# Patient Record
Sex: Female | Born: 1935 | Race: White | Hispanic: No
Health system: Southern US, Community
[De-identification: ages and names within clinical notes are randomized; demographics above are authoritative.]

---

## 2005-10-20 ENCOUNTER — Emergency Department: Payer: Self-pay | Admitting: Emergency Medicine

## 2005-10-20 ENCOUNTER — Other Ambulatory Visit: Payer: Self-pay

## 2011-10-08 ENCOUNTER — Emergency Department: Payer: Self-pay | Admitting: *Deleted

## 2011-10-26 ENCOUNTER — Ambulatory Visit: Payer: Self-pay | Admitting: Internal Medicine

## 2011-10-27 ENCOUNTER — Inpatient Hospital Stay: Payer: Self-pay | Admitting: Surgery

## 2011-10-27 LAB — URINALYSIS, COMPLETE
Ketone: NEGATIVE
Ph: 6 (ref 4.5–8.0)
Protein: NEGATIVE
RBC,UR: 9 /HPF (ref 0–5)
Specific Gravity: 1.008 (ref 1.003–1.030)
Squamous Epithelial: <1
WBC UR: 118 /HPF (ref 0–5)

## 2011-10-27 LAB — COMPREHENSIVE METABOLIC PANEL
Anion Gap: 8 (ref 7–16)
Bilirubin,Total: 0.7 mg/dL (ref 0.2–1.0)
Chloride: 100 mmol/L (ref 98–107)
Co2: 27 mmol/L (ref 21–32)
Creatinine: 0.98 mg/dL (ref 0.60–1.30)
EGFR (African American): >60
EGFR (Non-African Amer.): 56 — ABNORMAL LOW
Glucose: 99 mg/dL (ref 65–99)
Osmolality: 272 (ref 275–301)
SGOT(AST): 39 U/L — ABNORMAL HIGH (ref 15–37)
SGPT (ALT): 14 U/L
Sodium: 135 mmol/L — ABNORMAL LOW (ref 136–145)

## 2011-10-27 LAB — CBC
HGB: 13.9 g/dL (ref 12.0–16.0)
MCV: 98 fL (ref 80–100)
Platelet: 188 10*3/uL (ref 150–440)
RBC: 4.26 10*6/uL (ref 3.80–5.20)

## 2011-10-30 LAB — URINE CULTURE

## 2011-11-25 ENCOUNTER — Ambulatory Visit: Payer: Self-pay | Admitting: Internal Medicine

## 2012-02-25 DEATH — deceased

## 2013-07-07 IMAGING — CT CT HEAD WITHOUT CONTRAST
2 series · 15 of 30 positions shown, 19 images · non-contrast
Comparison: none

REASON FOR EXAM: fall head injury
COMMENTS:

[Series 2: without · axial · non-contrast · 0.45mm/px · z∈[-158,-28]mm · 13 of 32 slices shown, 17 images]
[im 3/32  brain]
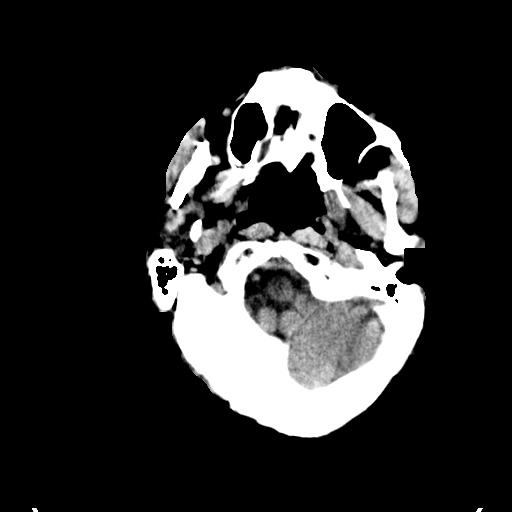
[im 3/32  bone]
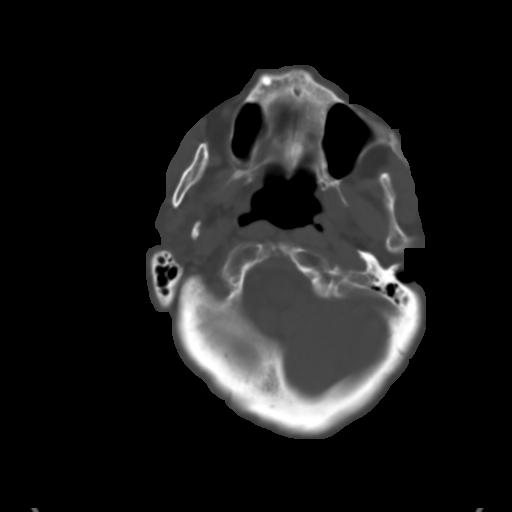
[im 5/32  brain]
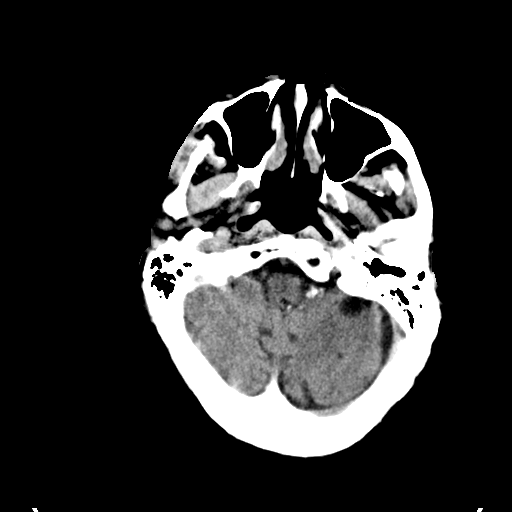
[im 7/32  brain]
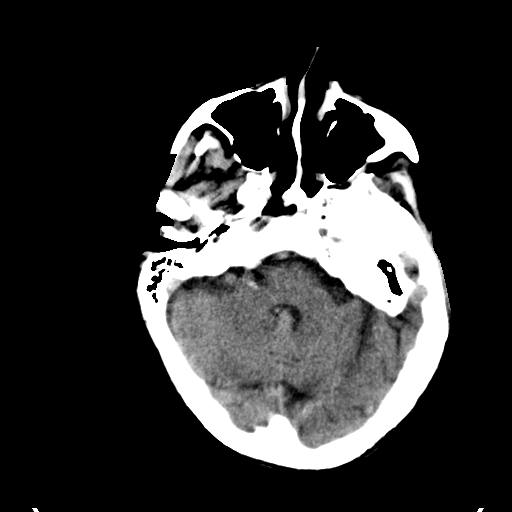
[im 9/32  brain]
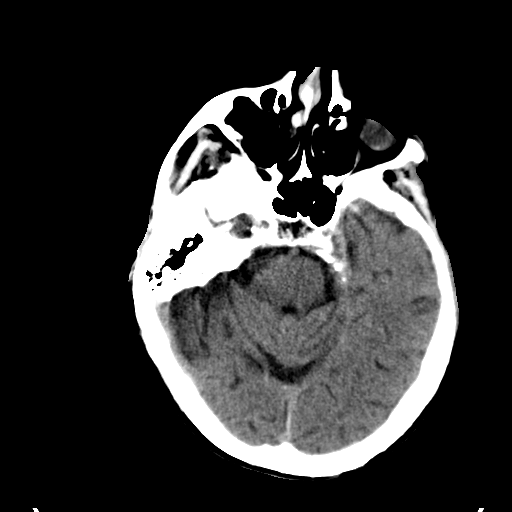
[im 12/32  brain]
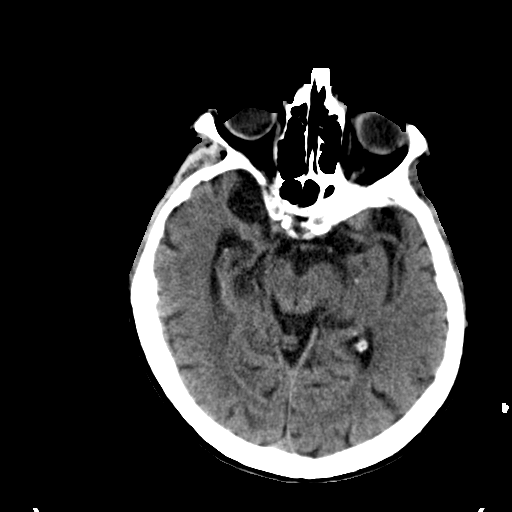
[im 12/32  bone]
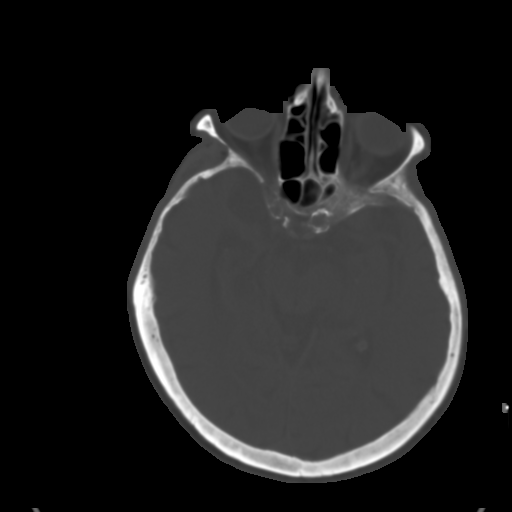
[im 14/32  brain]
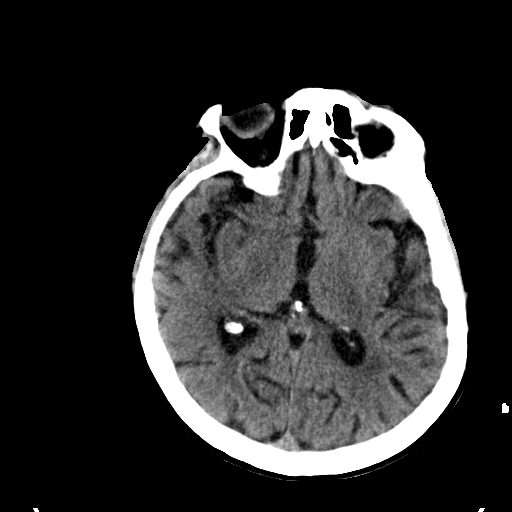
[im 16/32  brain]
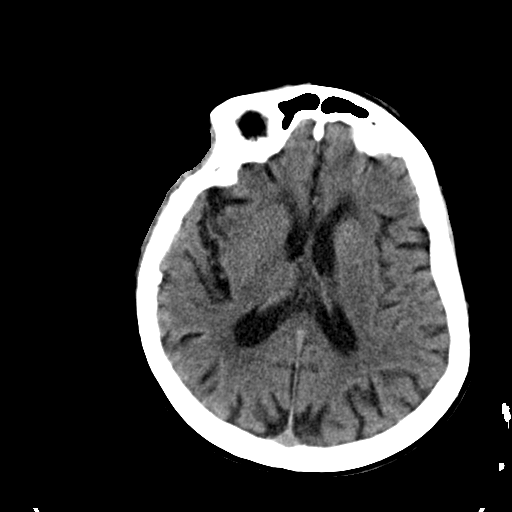
[im 18/32  brain]
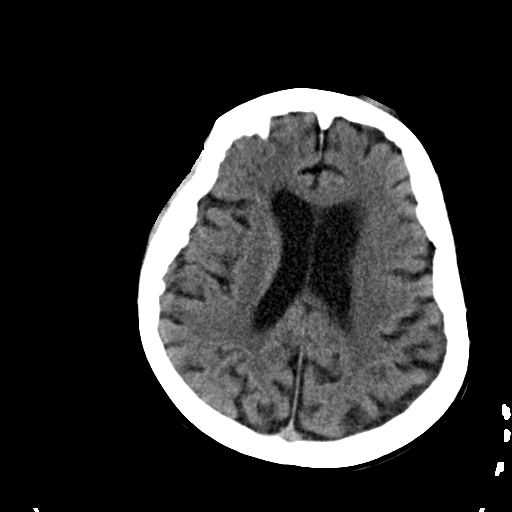
[im 20/32  brain]
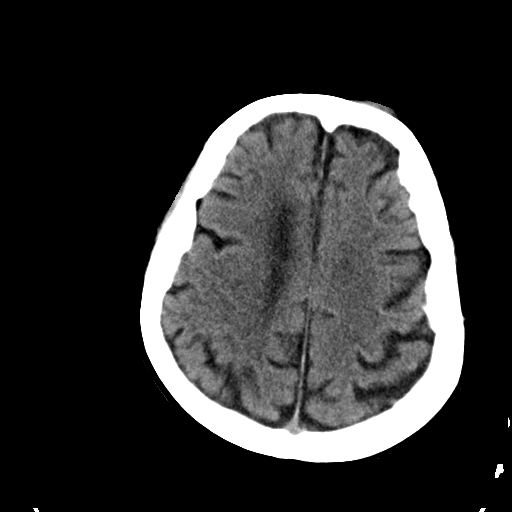
[im 20/32  bone]
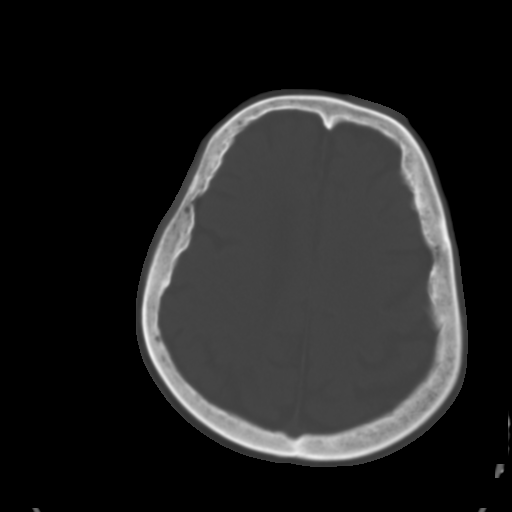
[im 23/32  brain]
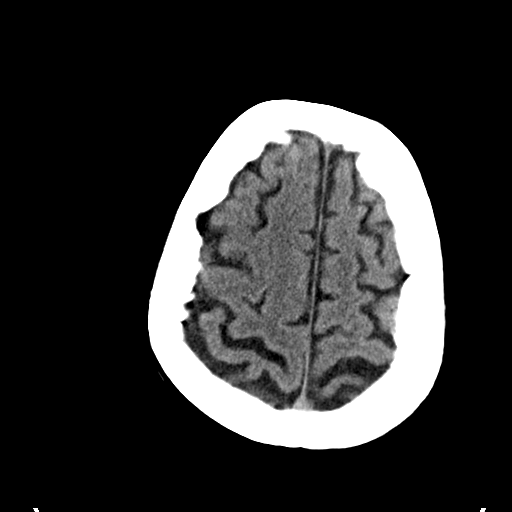
[im 25/32  brain]
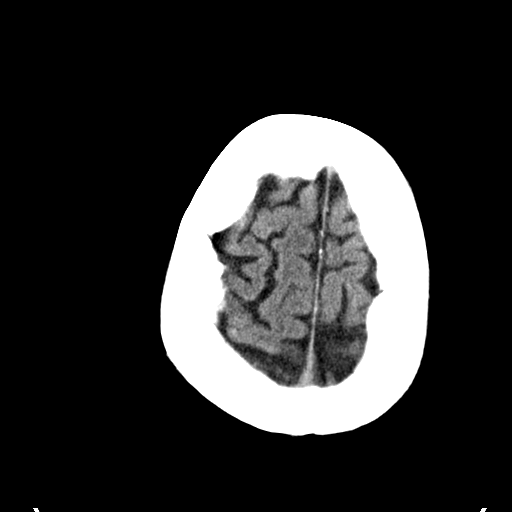
[im 27/32  brain]
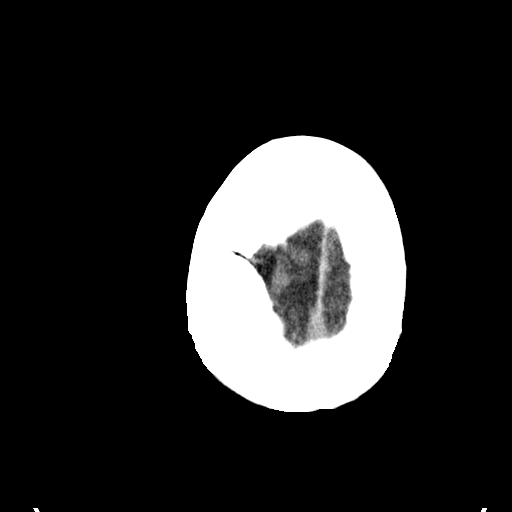
[im 29/32  brain]
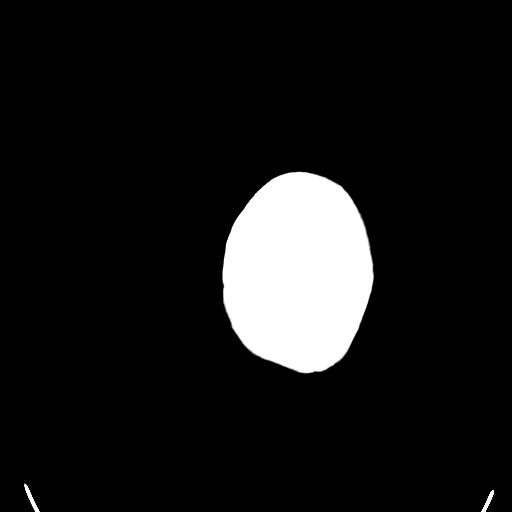
[im 29/32  bone]
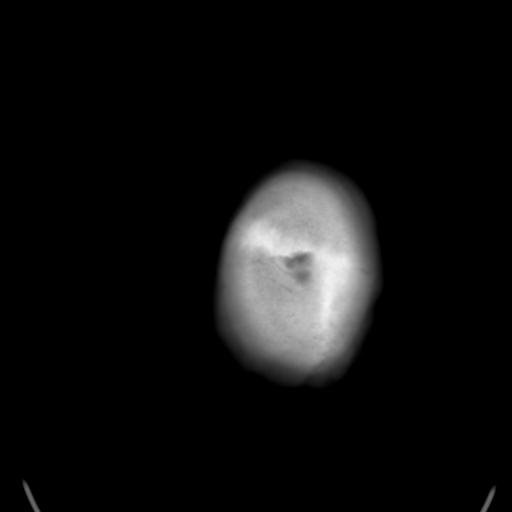

[Series 3: bone · axial · 0.45mm/px · z∈[-158,-138]mm · 2 of 32 slices shown]
[im 3/32  bone]
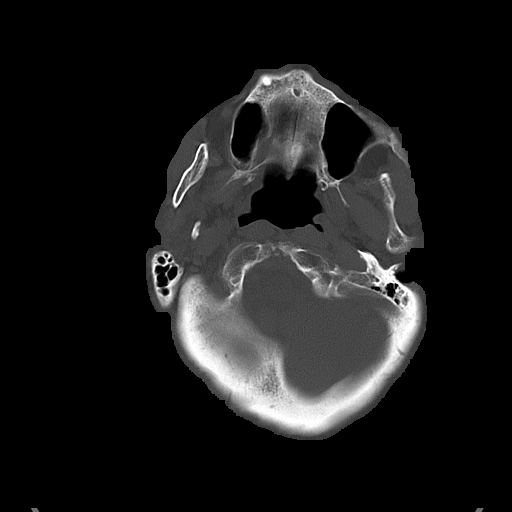
[im 7/32  bone]
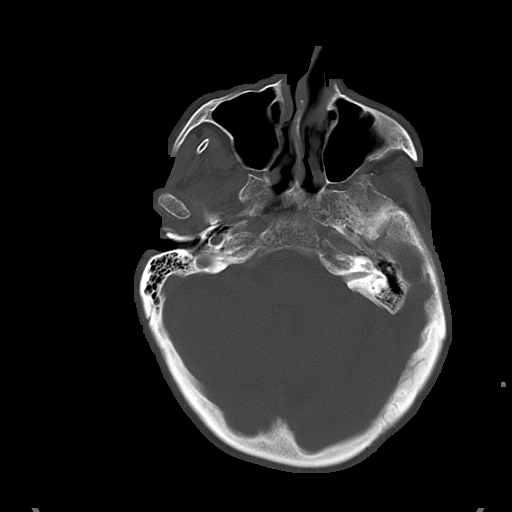

[15 of 30 positions shown; findings below may reference images not displayed]

PROCEDURE:     CT  - CT HEAD WITHOUT CONTRAST  - October 09, 2011  [DATE]

RESULT:     Axial noncontrast CT scanning was performed through the brain
with reconstructions at 5 mm intervals and slice thicknesses.

There are mild age-related atrophic changes with compensatory
ventriculomegaly. There is no intracranial hemorrhage nor intracranial mass
effect. There is no evidence of an evolving ischemic infarction. There is
dense calcification in the region of the distal aspect of the left internal
carotid artery which may reflect an aneurysm. This measures no more than 7
mm in diameter and there is no evidence of active bleeding.

At bone window settings the observed portions of the paranasal sinuses and
mastoid air cells are clear. There is soft tissue swelling over the left
orbit. The globe is grossly intact and I see no significant pre- or
postseptal edema.
IMPRESSION: 1. There are mild age-related atrophic changes.
2. There is no evidence of an acute ischemic or hemorrhagic infarction.
3. There is calcification in the region of the distal left internal carotid
artery which may reflect a aneurysm measuring up to 7 mm in diameter. There
is a small amount of soft tissue swelling above the left orbit. I see no
underlying fracture.

A preliminary report was sent to the [HOSPITAL] the conclusion
of the study.

## 2013-07-25 IMAGING — CT CT ABD-PELV W/O CM
1 of 2 series · 14 of 32 positions shown, 18 images · non-contrast
Comparison: none

REASON FOR EXAM: (1) pain; (2) pain  no oral no iv  contrast
COMMENTS:

[Series 2: 3mm soft tissue · axial · 0.76mm/px · z∈[-700,-338]mm · 14 of 138 slices shown, 18 images]
[im 11/138  soft-tissue]
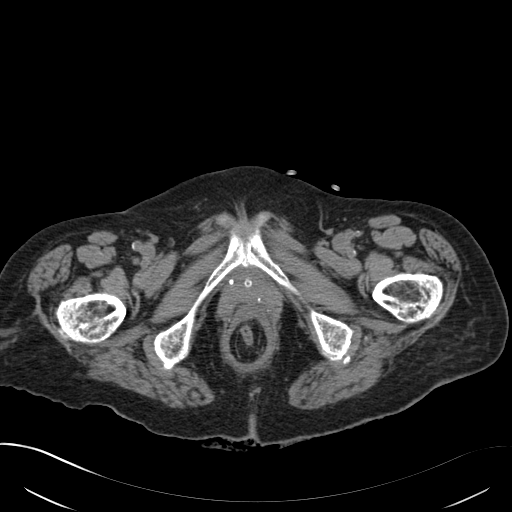
[im 11/138  bone]
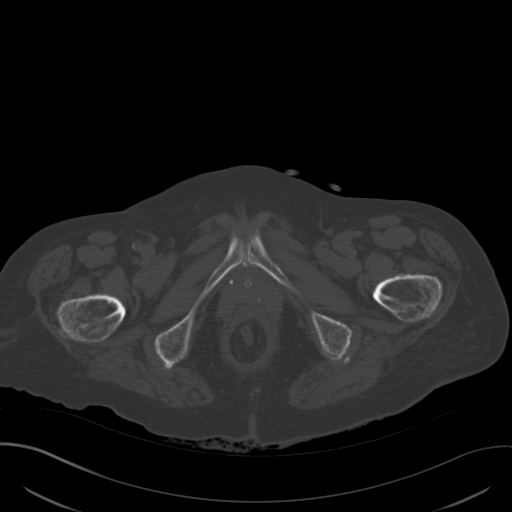
[im 22/138  soft-tissue]
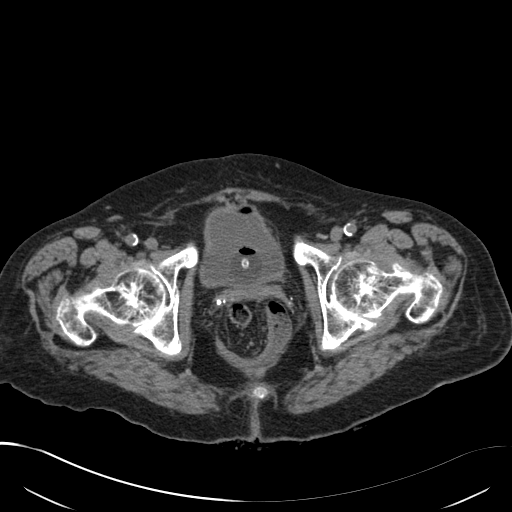
[im 33/138  soft-tissue]
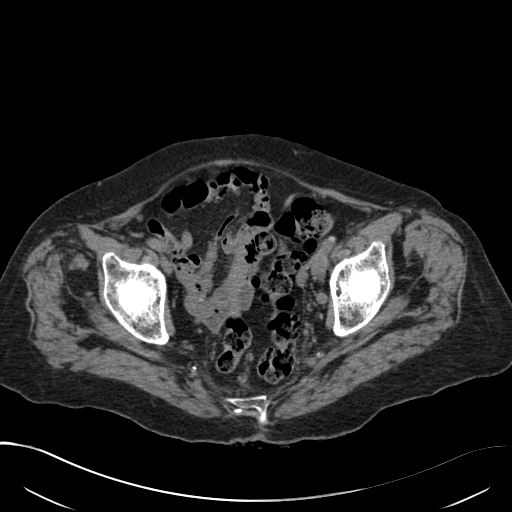
[im 44/138  soft-tissue]
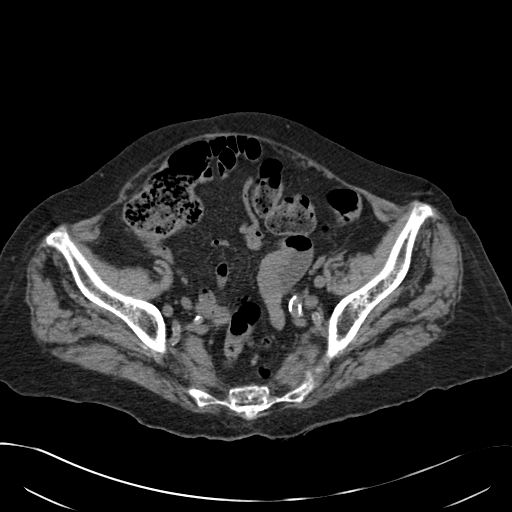
[im 55/138  soft-tissue]
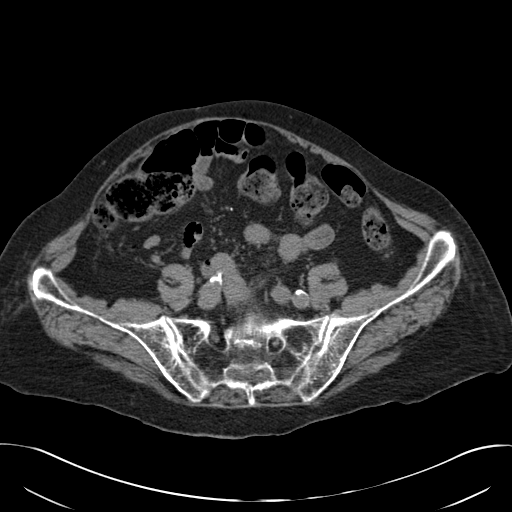
[im 66/138  soft-tissue]
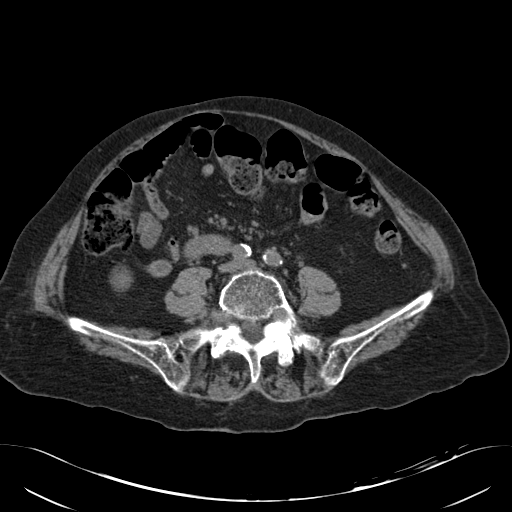
[im 77/138  soft-tissue]
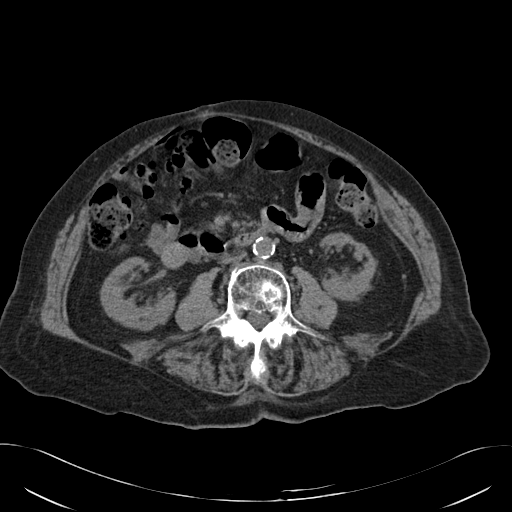
[im 88/138  soft-tissue]
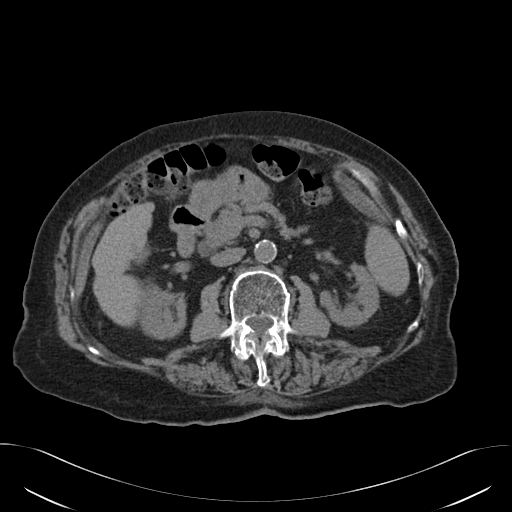
[im 99/138  soft-tissue]
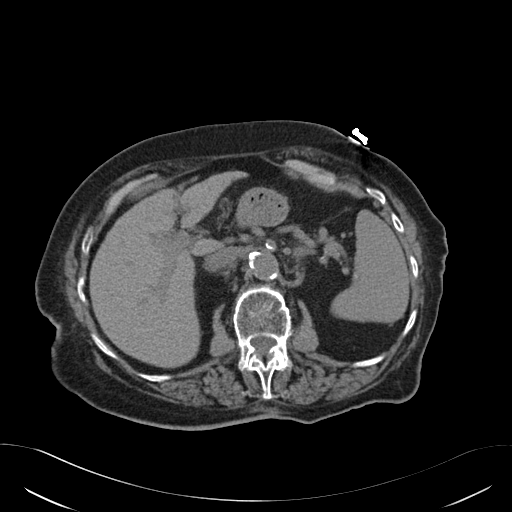
[im 99/138  bone]
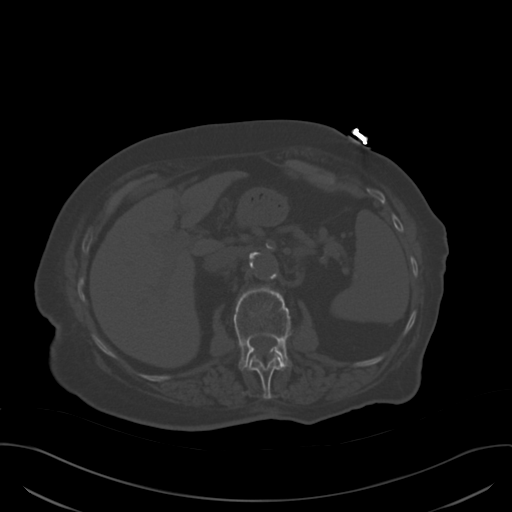
[im 110/138  soft-tissue]
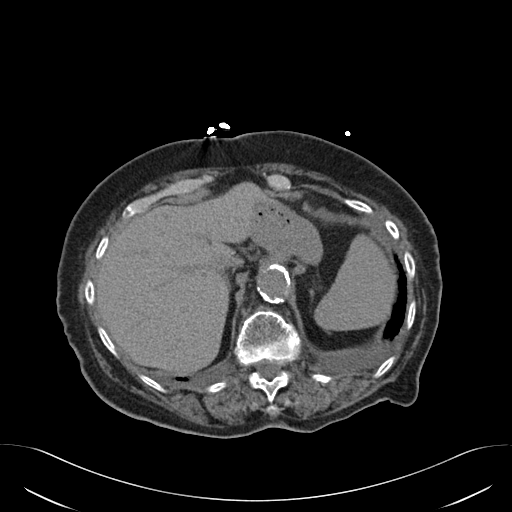
[im 116/138  lung]
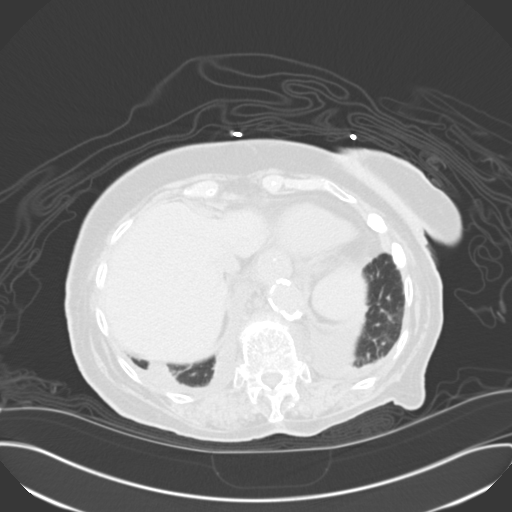
[im 121/138  soft-tissue]
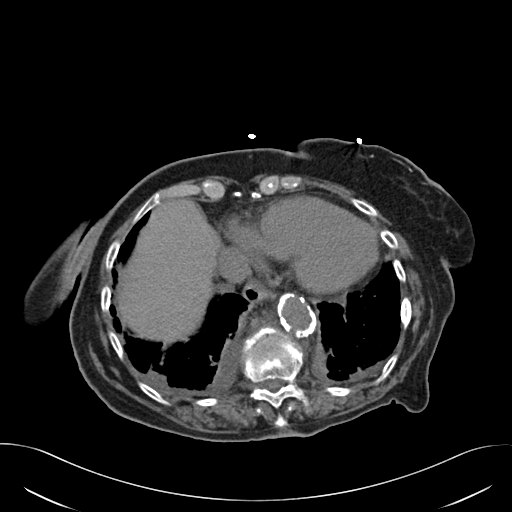
[im 121/138  lung]
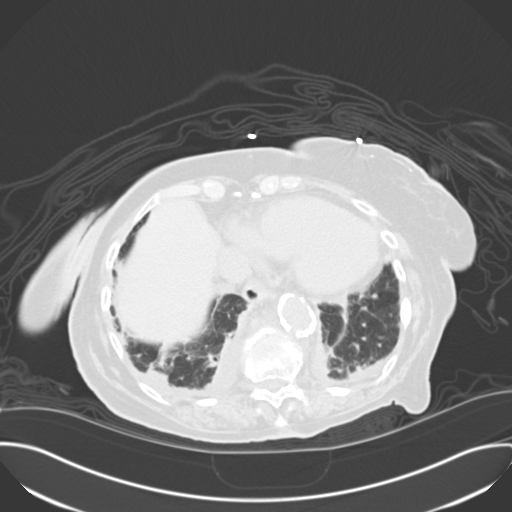
[im 127/138  lung]
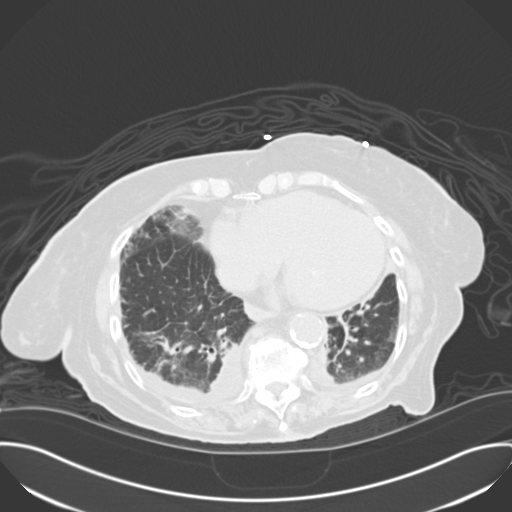
[im 132/138  soft-tissue]
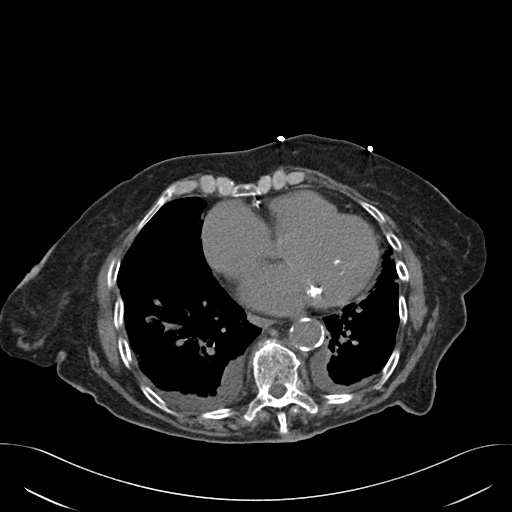
[im 132/138  lung]
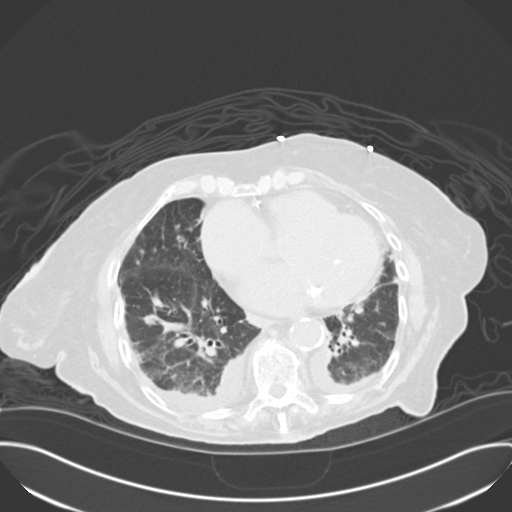

[14 of 32 positions shown; findings below may reference images not displayed]

PROCEDURE:     CT  - CT ABDOMEN AND PELVIS W[DATE]  [DATE]

RESULT:     Axial noncontrast CT scanning was performed through the abdomen
and pelvis with reconstructions at 3 mm intervals and slice thicknesses.
Review of multiplanar reconstructed images was performed separately on the
VIA monitor.

There are small bilateral pleural effusions layering posteriorly. There are
patchy areas of subsegmental atelectasis in both lower lobes. There is also
peribronchial cuffing. The cardiac chambers are enlarged.

The liver exhibits no focal mass. The gallbladder is surgically absent. The
spleen is not enlarged. The stomach is nondistended. There are no adrenal
masses. The pancreas is largely fatty replaced. The caliber of the abdominal
aorta is normal. The unopacified loops of small and large bowel exhibit no
evidence of ileus nor of obstruction. The kidneys exhibit no evidence of
obstruction. The perinephric fat is normal in density. I see no stones
within either kidney. There is likely a hyperdense cyst in the anterior
aspect of the upper pole of the left kidney measuring approximately 8 mm in
diameter.

Within the pelvis the urinary bladder is partially distended with fluid and
air. A Foley catheter balloon is present. The perirectal fat at the anal
region is consistent with the clinically known prolapse. There is also air
in the perirectal soft tissues on the left which is nonspecific in
appearance and may be related to the prolapse. I see no pelvic nor inguinal
lymphadenopathy.
IMPRESSION: 1. I do not see evidence of bowel obstruction or ileus. There is prolapse of
the rectum as noted clinically as well is prolapse of perirectal fat. This
extends off the inferior margin of the scan. There is gas in the perirectal
soft tissues on images 115 through 127. This may reflect a fistulous
connection or could possibly reflect a perirectal abscess. But with the
distortion of the anatomy due to the prolapse this could reflect a
physiologic gas collection. Correlation with the patient's clinical exam of
the perirectal region is needed.
2. I see no acute urinary tract abnormality nor acute hepatobiliary
abnormality.
3. There are small bilateral pleural effusions. There is bibasilar
atelectasis. There is enlargement of the cardiac chambers.

## 2014-11-18 NOTE — Discharge Summary (Signed)
PATIENT NAME:  Jill Li, Jill Li MR#:  [id number] DATE OF BIRTH:  Dec 12, 1909  DATE OF ADMISSION:  10/27/2011 DATE OF DISCHARGE:  11/02/2011   FINAL DIAGNOSIS: Rectal prolapse.   HOSPITAL COURSE SUMMARY: The patient was admitted with an irreducible rectal prolapse. She was taken to the operating room after admission to the hospital on the third by Dr. Michela PitcherEly at which point a successful manual decompression was able to be performed. She was also seen by palliative care. The patient was NO CODE/DO NOT RESUSCITATE during her hospital stay. Her diet was able to be advanced and she had normal bowel function following this with no evidence of recurrent rectal prolapse. Discussions were undertaken with the family regarding further care if prolapse recurred. The patient had some unsteadiness on her feet and for this reason the referral was made for [facility] upon discharge. This was accomplished on 04/08. The patient had no further evidence of rectal prolapse during her hospitalization following the reduction in the operating room.   DISCHARGE MEDICATIONS:  1. MiraLAX 17 grams by mouth daily.  2. Dulcolax 10 mg by mouth at bedtime. 3. Septra double strength 1 tab p.o. b.i.d. for five days.   DIET:  Regular.  ACTIVITY: As per physical therapy.   FOLLOWUP: P.r.n. followup in the office   ____________________________ Redge GainerMark A. Egbert GaribaldiBird, MD mab:bjt D: 11/02/2011 10:27:39 ET T: 11/02/2011 11:01:37 ET JOB#: [id number]  cc: Steele SizerMark A. Crissman, MD Raynald KempMARK A Aquarius Tremper MD ELECTRONICALLY SIGNED 11/02/2011 11:58

## 2014-11-18 NOTE — Consult Note (Signed)
Chief Complaint:   Subjective/Chief Complaint status post reduction of rectal prolapse y'day, doing ok, doesn't talk   VITAL SIGNS/ANCILLARY NOTES: **Vital Signs.:   04-Apr-13 10:00   Vital Signs Type Q 4hr   Temperature Temperature (F) 98.8   Celsius 37.1   Temperature Source axillary   Pulse Pulse 69   Pulse source per Dinamap   Respirations Respirations 18   Systolic BP Systolic BP 105   Diastolic BP (mmHg) Diastolic BP (mmHg) 54   Mean BP 71   BP Source Dinamap   Pulse Ox % Pulse Ox % 94   Pulse Ox Activity Level  At rest   Oxygen Delivery Room Air/ 21 %   Brief Assessment:   Cardiac Regular  murmur present    Respiratory normal resp effort    Gastrointestinal Normal   Assessment/Plan:  Assessment/Plan:   Assessment 1. rectal prolapse: status post reduction in OR by dr Michela Pitcherely, doing fine.  2. GNR urinary tract infection: based on urinalysis, urine c/s growing GNR. continue rocephin, can discharge on po cipro for 1 more day to finish 3 days course of uncomplicated Urinary Tract Infection. if here rocephin can be stopped after tomorrow's dose.  3. dementia: at baseline.  time spent : 15 min    Plan medically clear. Will sign off.  Please call with questions.   Electronic Signatures: Patricia PesaShah, Grey Schlauch S (MD)  (Signed 04-Apr-13 13:18)  Authored: Chief Complaint, VITAL SIGNS/ANCILLARY NOTES, Brief Assessment, Assessment/Plan   Last Updated: 04-Apr-13 13:18 by Patricia PesaShah, Floyd Wade S (MD)

## 2014-11-18 NOTE — H&P (Signed)
PATIENT NAME:  Jill Li MR#:  [id number] DATE OF BIRTH:  1910/03/29  DATE OF ADMISSION:  10/27/2011  PRIMARY CARE PHYSICIAN: Dr. Dossie Arbour ADMITTING PHYSICIAN: Dr. Michela Pitcher    CHIEF COMPLAINT: Rectal prolapse, rectal pain.   BRIEF HISTORY: Jill Li is a 81 year old woman seen in the Emergency Room with a short history of rectal prolapse. She is currently living in assisted living facility for some mild dementia and has recently been moved to that facility. Previously she was living at home. She has family with her this evening. She apparently was noted to have some rectal prolapse at the nursing home today. It was reduced by one of the nurses but recurred this evening with an increase in her back and perirectal pain. They could not reduce it as an outpatient and transferred her to the Emergency Room for further evaluation. In the ED evaluation could not reduce the prolapse either and the surgical service was consulted.   Family denies any other previous history of rectal problems. She has not had a colonoscopy within the last decade. She has not had any previous GI problems including hepatitis, yellow jaundice, pancreatitis, peptic ulcer disease, gallbladder disease or diverticulitis. She does have a long history of constipation and has had difficulty with bowel function in the past. She has not had any previous abdominal surgery other than a hysterectomy. She has no other medical problems specifically has no cardiac disease, hypertension, or diabetes.   MEDICATIONS: She takes no medications regularly.   ALLERGIES: Has no medical allergies.   SOCIAL HISTORY: She is not a cigarette smoker, does not drink any alcohol and is currently in a dependent living situation.   REVIEW OF SYSTEMS: Really not possible as the patient does have some mild dementia. The family relates no other current medical complaints.     PHYSICAL EXAMINATION:  GENERAL: She is an alert, responsive woman who is a  bit confused but does not appear to be any pain at rest.   VITALS: Blood pressure 180/77, heart rate 95 and regular, oxygen saturation 94%.   HEENT: Reveals some mild facial bruising but no scleral icterus. Her pupils are equally round. There is no facial deformity but an abrasion on her cheek.    NECK: Supple without adenopathy. Trachea is midline.   CHEST: Clear but she has very distant breath sounds.   CARDIAC: 3/6 systolic murmur but she appears to be in normal sinus rhythm. She does not have any significant abnormality of her PMI.   ABDOMEN: Benign with no other abdominal findings. She has no abdominal tenderness. No point tenderness. No guarding. No rebound. No hernias. No masses.   RECTAL: Reveals greater than 10 cm segment of prolapsed edematous rectal mucosa. With some IV fentanyl and nursing assistance I attempted to reduce the prolapse but could not get it back into position. The patient is quite uncomfortable and is not cooperative. There was some obvious mucosal slough. There is no evidence of any other ischemia.   EXTREMITIES: Lower extremity exam reveals some contusions but no evidence of any deformity.   PSYCHIATRIC: Exam is not possible in this situation.   LABORATORY, DIAGNOSTIC AND RADIOLOGICAL DATA: The Emergency Room physician obtained a CT scan on this patient for evaluation of her prolapse and the prolapse was confirmed.   IMPRESSION: This woman appears to have an extensive complete rectal prolapse. It is not reducible in the current situation. The medical doctor did not feel she had any medical problems requiring admission so we  will admit her to the surgical service and ask for risk assessment by the medical hospitalist group and plan to see if we can reduce this without surgical intervention. Will put some granulated sugar on it in an effort to reduce the edema and see if we can reduce the prolapse that way. Failing reduction in that situation we will take her to  surgery for examination under anesthesia and possible reduction. I do not have much hope that this will stay reduced and have talked to the family about a better bowel regimen. At 89 she would be a very poor candidate for a resection or rectal removal. The family is in agreement at this point.    ____________________________ Carmie Endalph L. Ely III, MD rle:cms D: 10/27/2011 21:39:48 ET T: 10/28/2011 07:22:41 ET JOB#: [id number]  cc: Quentin Orealph L. Ely III, MD, <Dictator> Steele SizerMark A. Crissman, MD Quentin OreALPH L ELY MD ELECTRONICALLY SIGNED 10/28/2011 22:00

## 2014-11-18 NOTE — Consult Note (Signed)
PATIENT NAME:  Jill Li, [name] MR#:  [id number] DATE OF BIRTH:  1910/06/25  DATE OF CONSULTATION:  10/28/2011  REFERRING PHYSICIAN:  Quentin Orealph L. Ely, III, MD   CONSULTING PHYSICIAN:  Geneen Dieter S. Sherryll BurgerShah, MD  PRIMARY CARE PHYSICIAN: Vonita MossMark Crissman, MD   REASON FOR CONSULTATION: Preop medical clearance.   HISTORY OF PRESENT ILLNESS: The patient is a 81 year old female with no significant medical problems, is seen in consultation for preop medical clearance for possible reduction of rectal prolapse in the OR by Dr. Michela PitcherEly. The patient was admitted yesterday with a rectal prolapse. She is a 81 year old pleasant demented lady denying any symptoms although she is quite sleepy and confused at her baseline. Her son and daughter-in-law were at bedside, denied any new issues with her. She has been living at [facility] for the last two weeks. Before that, she lived at home.   PAST MEDICAL HISTORY: Irregular heartbeats.   MEDICATIONS AT HOME:  None.   ALLERGIES: No known drug allergies.   SOCIAL HISTORY: No smoking. No alcohol. She lived at [facility] for the last two weeks, before that she lived at home.   REVIEW OF SYSTEMS: Unable to be obtained as the patient is demented.   FAMILY HISTORY: Negative for coronary artery disease or diabetes.   PHYSICAL EXAMINATION:   VITAL SIGNS: Temperature 97.4, heart rate 65 per minute, respirations 18 per minute, blood pressure 132/66 mmHg. She is saturating 93% on room air.    LUNGS: Clear to auscultation bilaterally.  No wheezing, rales, rhonchi, or crepitation.   CARDIOVASCULAR: S1, S2 normal. She has a 3/6 systolic ejection murmur. No gallops. No rubs.   ABDOMEN: Soft, benign. Bowel sounds are present.   EXTREMITIES: No pedal edema, cyanosis or clubbing. She has some contusion but no evidence of any deformity.   NEUROLOGICAL: Nonfocal examination, unable to cooperate with exam. She is quite sleepy.   PSYCHIATRIC: Unable to evaluate as the patient has  quite severe dementia.   SKIN: No obvious rash, lesion, or ulcer except some contusion in the lower extremity.   LABORATORY, DIAGNOSTIC AND RADIOLOGICAL DATA:  Normal BMP except sodium 135.  Normal liver function tests except AST of 39.  Normal CBC.  Urinalysis showed 1+ blood, 3+ leukocyte esterase, 9 RBCs, 118 WBCs and 1+ bacteria.  CT scan of the abdomen and pelvis without contrast shows no evidence of bowel obstruction or ileus but has gas in the perirectal soft tissue reflecting fistulous connection or possibly perirectal abscess. Possible prolapse. Small bilateral pleural effusion and bilateral atelectasis. Enlargement of cardiac chambers.   IMPRESSION AND PLAN:  1. Preoperative medical clearance: The patient is medically cleared for planned surgery (under anesthesia reduction of rectal prolapse in the OR). Certainly with her age there is a risk of myocardial infarction, but she has been very healthy all her life. There is no documented heart/lung history. Her CT did show some enlarged heart which could be due to underlying blood pressure which has not also been a known history. Most possible risk postoperatively would be worsening of her dementia, mental status changes. This was discussed with family and Dr. Excell Seltzerooper.  2. Possible urinary tract infection based on urinalysis: We will obtain urine culture and sensitivity. Start her on Rocephin.  3. Dementia: Likely at baseline.   CODE STATUS:  DO NOT RESUSCITATE.      TIME TAKEN: Total time taking care of this patient is 40 minutes.   ____________________________ Ellamae SiaVipul S. Sherryll BurgerShah, MD vss:cbb D: 10/28/2011 09:34:34 ET  T: 10/28/2011 12:31:12 ET JOB#: [id number]  cc: Donat Humble S. Sherryll Burger, MD, <Dictator> Steele Sizer, MD Carmie End, MD Adah Salvage. Excell Seltzer, MD Ellamae Sia [facility] MD ELECTRONICALLY SIGNED 10/28/2011 13:01

## 2014-11-18 NOTE — Consult Note (Signed)
Brief Consult Note: Diagnosis: preop medical clearance.   Patient was seen by consultant.   Consult note dictated.   Recommend to proceed with surgery or procedure.   Orders entered.   Discussed with Attending MD.   Comments: 1. preop medical clearance: cleared for planned surgery. likely reduction of prolapse under anesthesia, possible medical risk postop would be worsening of her dementia/mental status, certainly Myocardial Infarction (Heart Attack) considering her age [age]) but less likely as she has been healthy all her life.  2. possible urinary tract infection: based on urinalysis, obtain urine c/s, start rocephin  3. dementia: at baseline.  DNR, discussed with son and daughter in law at bedside.  Electronic Signatures: Patricia PesaShah, Kameran Mcneese S (MD)  (Signed 03-Apr-13 09:29)  Authored: Brief Consult Note   Last Updated: 03-Apr-13 09:29 by Patricia PesaShah, Monzerrat Wellen S (MD)

## 2014-11-18 NOTE — Op Note (Signed)
PATIENT NAME:  Jill Li, Jill Li MR#:  [id number] DATE OF BIRTH:  [birth year]  DATE OF PROCEDURE:  10/28/2011  PREOPERATIVE DIAGNOSIS: Rectal prolapse.  POSTOPERATIVE DIAGNOSIS: Rectal prolapse.  PROCEDURE PERFORMED: Rectal examination under anesthesia with reduction of rectal prolapse.   SURGEON: Quentin Orealph Li. Ely, MD  ANESTHESIA: General.   DESCRIPTION OF PROCEDURE: With the patient in the supine position, after induction of appropriate general anesthesia, the patient was placed in lithotomy position. Her rectal prolapse appeared to be approximately 8 cm, of the vaginated tissue. It was reduced with some difficulty. The bivalve retractor and then the rigid sigmoidoscope were utilized to push the leading edge back over the pelvic brim. Bimanual palpation could not identify the leading edge and it appeared to be back in the peritoneal cavity. There was no distal stool. There were a couple of areas of thrombosed hemorrhoids noted internally. Mucosa otherwise appeared to be viable. A sterile dressing was applied after placing a pack of Avitene and Gelfoam. Sterile dressing was applied. The patient was returned to the recovery room having tolerated the procedure well. Sponge, instrument, and needle counts were correct x2 in the operating room.  ____________________________ Quentin Orealph Li. Ely III, MD rle:slb D: 10/28/2011 21:37:30 ET T: 10/29/2011 09:40:39 ET JOB#: [id number]  cc: Carmie Endalph Li. Ely III, MD, <Dictator> Steele SizerMark A. Crissman, MD Quentin OreALPH Li ELY MD ELECTRONICALLY SIGNED 11/06/2011 7:23
